# Patient Record
Sex: Male | Born: 1950 | Race: White | Hispanic: No | Smoking: Current some day smoker
Health system: Southern US, Community
[De-identification: ages and names within clinical notes are randomized; demographics above are authoritative.]

---

## 2014-09-03 ENCOUNTER — Emergency Department (HOSPITAL_COMMUNITY)
Admission: EM | Admit: 2014-09-03 | Discharge: 2014-09-04 | Disposition: A | Discharge status: Home / Self Care | Payer: PRIVATE HEALTH INSURANCE | Attending: Emergency Medicine | Admitting: Emergency Medicine

## 2014-09-03 ENCOUNTER — Emergency Department (HOSPITAL_COMMUNITY): Payer: PRIVATE HEALTH INSURANCE

## 2014-09-03 ENCOUNTER — Encounter (HOSPITAL_COMMUNITY): Payer: Self-pay | Admitting: Emergency Medicine

## 2014-09-03 DIAGNOSIS — R42 Dizziness and giddiness: Secondary | ICD-10-CM | POA: Insufficient documentation

## 2014-09-03 DIAGNOSIS — Z79899 Other long term (current) drug therapy: Secondary | ICD-10-CM | POA: Insufficient documentation

## 2014-09-03 DIAGNOSIS — H6123 Impacted cerumen, bilateral: Secondary | ICD-10-CM

## 2014-09-03 DIAGNOSIS — Z7951 Long term (current) use of inhaled steroids: Secondary | ICD-10-CM | POA: Diagnosis not present

## 2014-09-03 DIAGNOSIS — Z720 Tobacco use: Secondary | ICD-10-CM | POA: Diagnosis not present

## 2014-09-03 DIAGNOSIS — R200 Anesthesia of skin: Secondary | ICD-10-CM | POA: Insufficient documentation

## 2014-09-03 LAB — CBC WITH DIFFERENTIAL/PLATELET
BASOS PCT: 1 % (ref 0–1)
Basophils Absolute: 0.1 10*3/uL (ref 0.0–0.1)
EOS ABS: 0.1 10*3/uL (ref 0.0–0.7)
EOS PCT: 1 % (ref 0–5)
HCT: 41.9 % (ref 39.0–52.0)
HEMOGLOBIN: 14.3 g/dL (ref 13.0–17.0)
Lymphocytes Relative: 35 % (ref 12–46)
Lymphs Abs: 2.4 10*3/uL (ref 0.7–4.0)
MCH: 31.5 pg (ref 26.0–34.0)
MCHC: 34.1 g/dL (ref 30.0–36.0)
MCV: 92.3 fL (ref 78.0–100.0)
MONOS PCT: 9 % (ref 3–12)
Monocytes Absolute: 0.6 10*3/uL (ref 0.1–1.0)
Neutro Abs: 3.7 10*3/uL (ref 1.7–7.7)
Neutrophils Relative %: 54 % (ref 43–77)
Platelets: 229 10*3/uL (ref 150–400)
RBC: 4.54 MIL/uL (ref 4.22–5.81)
RDW: 13.5 % (ref 11.5–15.5)
WBC: 6.7 10*3/uL (ref 4.0–10.5)

## 2014-09-03 LAB — I-STAT CHEM 8, ED
BUN: 14 mg/dL (ref 6–23)
Calcium, Ion: 1.18 mmol/L (ref 1.13–1.30)
Chloride: 100 mmol/L (ref 96–112)
Creatinine, Ser: 1.1 mg/dL (ref 0.50–1.35)
Glucose, Bld: 91 mg/dL (ref 70–99)
HEMATOCRIT: 44 % (ref 39.0–52.0)
Hemoglobin: 15 g/dL (ref 13.0–17.0)
Potassium: 4 mmol/L (ref 3.5–5.1)
Sodium: 139 mmol/L (ref 135–145)
TCO2: 24 mmol/L (ref 0–100)

## 2014-09-03 NOTE — ED Notes (Addendum)
Pt st's he has been feeling dizzy since approx 8am today,  St's earlier today he had tingling sensation around his mouth but has subsided.  St's he has continued to feel dizzy.  Neuro exam in triage neg at this time

## 2014-09-04 MED ORDER — DOCUSATE SODIUM 50 MG/5ML PO LIQD
50.0000 mg | ORAL | Status: AC
  Administered 2014-09-04: 50 mg via OTIC
  Filled 2014-09-04: qty 10

## 2014-09-04 NOTE — Discharge Instructions (Signed)
Cerumen Impaction A cerumen impaction is when the wax in your ear forms a plug. This plug usually causes reduced hearing. Sometimes it also causes an earache or dizziness. Removing a cerumen impaction can be difficult and painful. The wax sticks to the ear canal. The canal is sensitive and bleeds easily. If you try to remove a heavy wax buildup with a cotton tipped swab, you may push it in further. Irrigation with water, suction, and small ear curettes may be used to clear out the wax. If the impaction is fixed to the skin in the ear canal, ear drops may be needed for a few days to loosen the wax. People who build up a lot of wax frequently can use ear wax removal products available in your local drugstore. SEEK MEDICAL CARE IF:  You develop an earache, increased hearing loss, or marked dizziness. Document Released: 07/29/2004 Document Revised: 09/13/2011 Document Reviewed: 09/18/2009 [facility] Patient Information 2015 [place], [place]. This information is not intended to replace advice given to you by your health care provider. Make sure you discuss any questions you have with your health care provider.  Dizziness  Dizziness means you feel unsteady or lightheaded. You might feel like you are going to pass out (faint). HOME CARE   Drink enough fluids to keep your pee (urine) clear or pale yellow.  Take your medicines exactly as told by your doctor. If you take blood pressure medicine, always stand up slowly from the lying or sitting position. Hold on to something to steady yourself.  If you need to stand in one place for a long time, move your legs often. Tighten and relax your leg muscles.  Have someone stay with you until you feel okay.  Do not drive or use heavy machinery if you feel dizzy.  Do not drink alcohol. GET HELP RIGHT AWAY IF:   You feel dizzy or lightheaded and it gets worse.  You feel sick to your stomach (nauseous), or you throw up (vomit).  You have trouble talking or  walking.  You feel weak or have trouble using your arms, hands, or legs.  You cannot think clearly or have trouble forming sentences.  You have chest pain, belly (abdominal) pain, sweating, or you are short of breath.  Your vision changes.  You are bleeding.  You have problems from your medicine that seem to be getting worse. MAKE SURE YOU:   Understand these instructions.  Will watch your condition.  Will get help right away if you are not doing well or get worse. Document Released: 06/10/2011 Document Revised: 09/13/2011 Document Reviewed: 06/10/2011 [facility] Patient Information 2015 [place], [place]. This information is not intended to replace advice given to you by your health care provider. Make sure you discuss any questions you have with your health care provider. Tonight you.  CT scan was normal.  If at anytime you develop numbness, tingling further dizziness, blurry vision.  Please go to the nearest hospital immediately.  Please follow-up with your primary care physician on your arrival.  Home

## 2014-09-04 NOTE — ED Notes (Signed)
Pt. Left with all belongings and refused wheelchair 

## 2014-09-04 NOTE — ED Provider Notes (Signed)
CSN: [id number]     Arrival date & time 09/03/14  1941 History   First MD Initiated Contact with Patient 09/03/14 2139     Chief Complaint  Patient presents with  . Dizziness     (Consider location/radiation/quality/duration/timing/severity/associated sxs/prior Treatment) HPI Comments: Patient reports that he's been dizzy but cannot quantify the dizziness.  He says the room is not spinning.  He does not feel like he needs to hold onto objects to walk or drive.  He denies any nausea.  He states about 6 PM he developed some numbness on his lower lip, worse on the left side than the right.  This has since resolved.  He and his daughter traveling for business.  [place].  He has a primary care physician at home only medication he takes is Nexium for GERD.  Has no history of TIAs, CVAs, hypertension, diabetes, head trauma. Denies any shortness of breath, chest pain, swelling in his legs, history of PE or DVT, no visual disturbances blurry vision, double vision halos  Patient is a 64 y.o. male presenting with dizziness. The history is provided by the patient.  Dizziness Quality:  Unable to specify Severity:  Mild Onset quality:  Gradual Duration:  14 hours Timing:  Constant Progression:  Unchanged Chronicity:  New Context: not when bending over, not with bowel movement, not with head movement, not with inactivity and not with physical activity   Relieved by:  None tried Worsened by:  Nothing Ineffective treatments:  None tried Associated symptoms: no headaches, no hearing loss, no nausea, no shortness of breath, no vision changes and no weakness   Risk factors: no multiple medications and no new medications     History reviewed. No pertinent past medical history. History reviewed. No pertinent past surgical history. No family history on file. History  Substance Use Topics  . Smoking status: Current Some Day Smoker    Types:  Cigars  . Smokeless tobacco: Not on file  . Alcohol Use: Yes     Comment: everyday    Review of Systems  Constitutional: Negative for fever.  HENT: Negative for congestion, ear discharge, ear pain, facial swelling and hearing loss.   Respiratory: Negative for shortness of breath.   Gastrointestinal: Negative for nausea.  Neurological: Positive for dizziness and numbness. Negative for weakness and headaches.  All other systems reviewed and are negative.     Allergies  Review of patient's allergies indicates no known allergies.  Home Medications   Prior to Admission medications   Medication Sig Start Date End Date Taking? Authorizing Provider  esomeprazole (NEXIUM) 40 MG capsule Take 40 mg by mouth daily at 12 noon.   Yes Historical Provider, MD  fluticasone (FLONASE) 50 MCG/ACT nasal spray Place 1 spray into both nostrils daily.   Yes Historical Provider, MD   BP 139/85 mmHg  Pulse 63  Temp(Src) 98.1 F (36.7 C) (Oral)  Resp 15  Ht 6' (1.829 m)  Wt 170 lb (77.111 kg)  BMI 23.05 kg/m2  SpO2 100% Physical Exam  Constitutional: He is oriented to person, place, and time. He appears well-developed and well-nourished. No distress.  HENT:  Head: Normocephalic and atraumatic.  I lateral cerumen impactions, left greater than right  Eyes: Pupils are equal, round, and reactive to light.  Neck: Normal range of motion.  Cardiovascular: Normal rate and regular rhythm.   Pulmonary/Chest: Effort normal and breath sounds normal.  Abdominal: Soft. Bowel sounds are  normal.  Musculoskeletal: Normal range of motion.  Lymphadenopathy:    He has no cervical adenopathy.  Neurological: He is alert and oriented to person, place, and time. He has normal strength. He displays normal reflexes. A cranial nerve deficit is present. No sensory deficit. He displays a negative Romberg sign.  Skin: Skin is warm. No rash noted. No pallor.  Nursing note and vitals reviewed.   ED Course  Procedures  (including critical care time) Labs Review Labs Reviewed  CBC WITH DIFFERENTIAL/PLATELET  I-STAT CHEM 8, ED    Imaging Review Ct Head Wo Contrast  09/04/2014   CLINICAL DATA:  Acute onset of dizziness. Tingling about the mouth. Initial encounter.  EXAM: CT HEAD WITHOUT CONTRAST  TECHNIQUE: Contiguous axial images were obtained from the base of the skull through the vertex without intravenous contrast.  COMPARISON:  None.  FINDINGS: There is no evidence of acute infarction, mass lesion, or intra- or extra-axial hemorrhage on CT.  Prominence of the ventricles and sulci reflects mild cortical volume loss. Mild cerebellar atrophy is noted.  The brainstem and fourth ventricle are within normal limits. The basal ganglia are unremarkable in appearance. The cerebral hemispheres demonstrate grossly normal gray-white differentiation. No mass effect or midline shift is seen.  There is no evidence of fracture; visualized osseous structures are unremarkable in appearance. The visualized portions of the orbits are within normal limits. The paranasal sinuses and mastoid air cells are well-aerated. Cerumen is noted at the external auditory canals bilaterally.  IMPRESSION: 1. No acute intracranial pathology seen on CT. 2. Mild cortical volume loss noted. 3. Cerumen noted at the external auditory canals bilaterally.   Electronically Signed   By: Roanna Raider M.D.   On: 09/04/2014 00:36     EKG Interpretation   Date/Time:  Tuesday September 03 2014 19:52:08 EST Ventricular Rate:  67 PR Interval:  188 QRS Duration: 88 QT Interval:  402 QTC Calculation: 424 R Axis:   61 Text Interpretation:  Normal sinus rhythm Normal ECG Confirmed by OTTER   MD, OLGA ([id number]) on 09/03/2014 11:50:06 PM     [place] CT scan results was discussed with the patient.  He was offered MRI after having his cerumen impactions removed.  He states he is back to his normal baseline.  He does have a primary care physician that he can follow at home.   He is been instructed to go to the closest hospital immediately if he develops symptoms similar to tonight's.   MDM   Final diagnoses:  Dizzy  Cerumen impaction, bilateral         Arman Filter, NP 09/04/14 [id number]  Olivia Mackie, MD 09/04/14 [phone]

## 2015-12-12 IMAGING — CT CT HEAD W/O CM
1 series · 15 of 30 positions shown, 19 images · non-contrast
Comparison: None.

CLINICAL DATA: Acute onset of dizziness. Tingling about the mouth.
Initial encounter.

EXAM:
CT HEAD WITHOUT CONTRAST
TECHNIQUE: Contiguous axial images were obtained from the base of the skull
through the vertex without intravenous contrast.

[Series 2: head 5.0 h30s · axial · 0.46mm/px · z∈[-147,-12]mm · 15 of 31 slices shown, 19 images]
[im 2/31  brain]
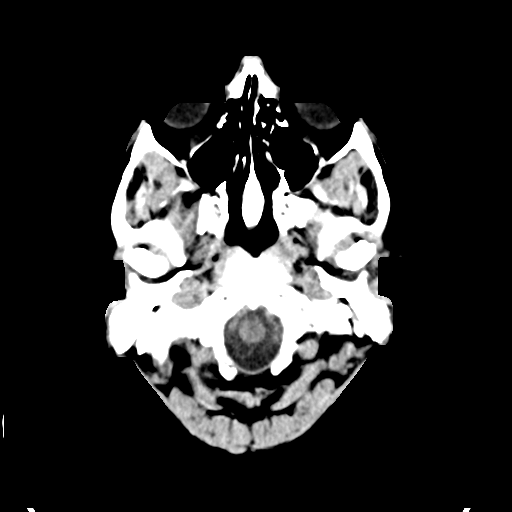
[im 2/31  bone]
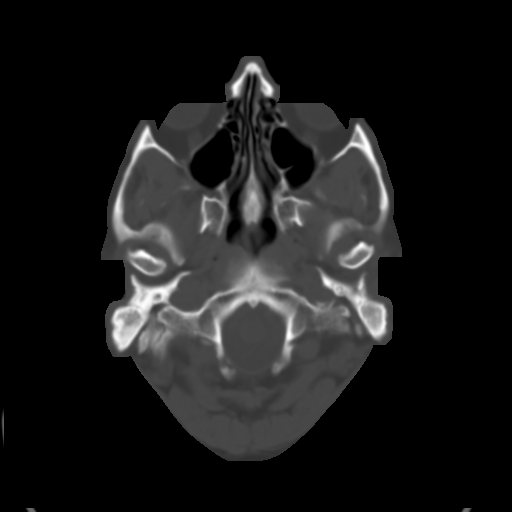
[im 4/31  brain]
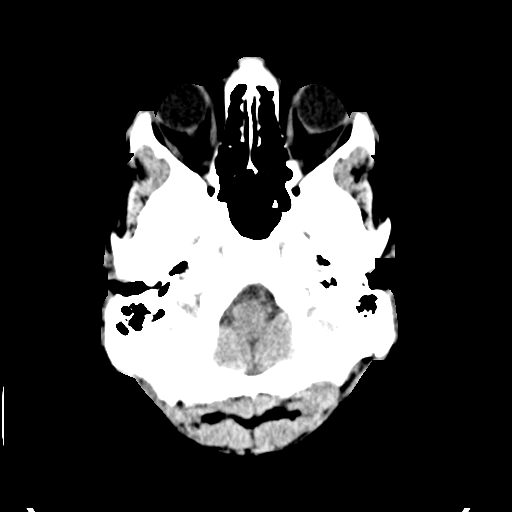
[im 6/31  brain]
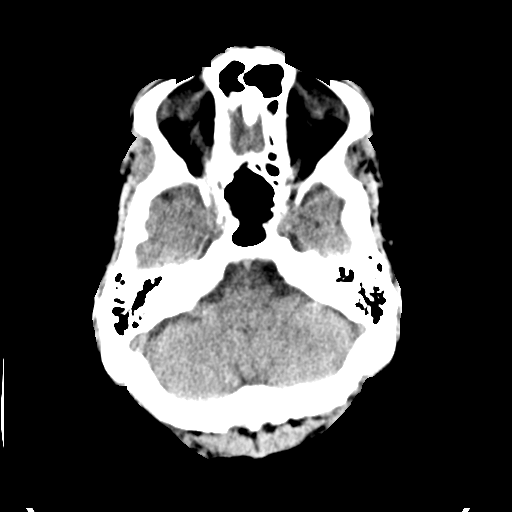
[im 8/31  brain]
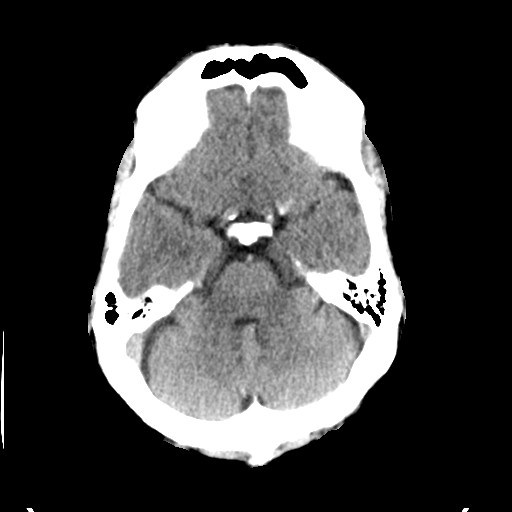
[im 10/31  brain]
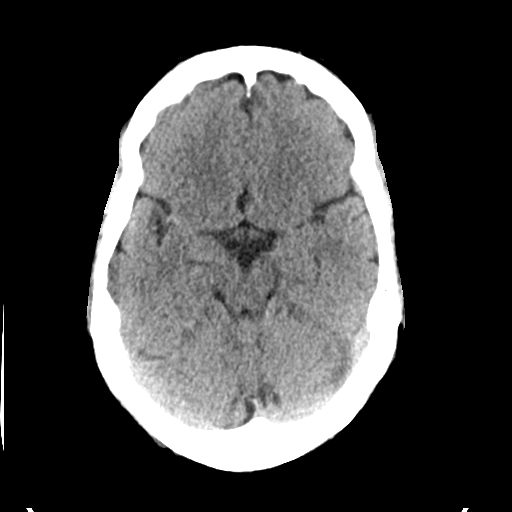
[im 10/31  bone]
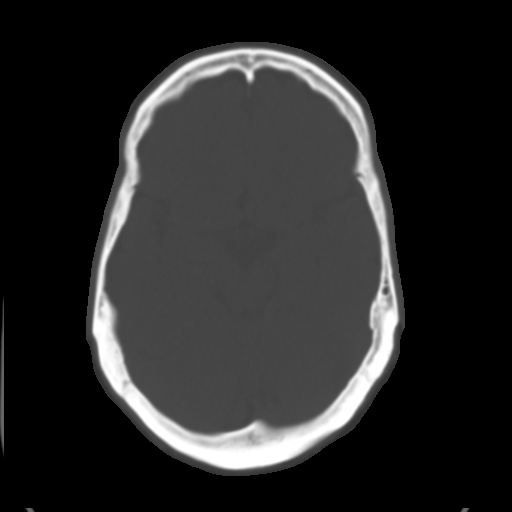
[im 12/31  brain]
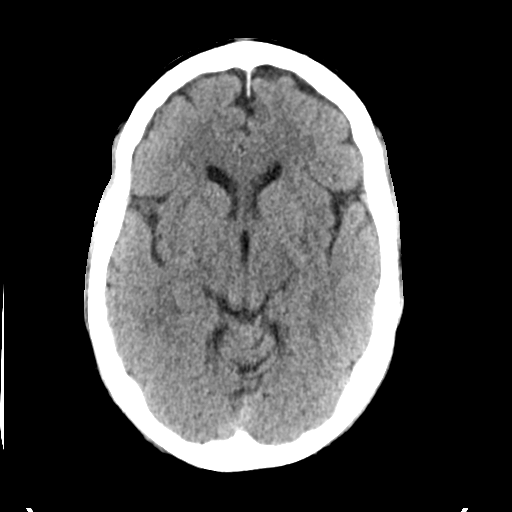
[im 14/31  brain]
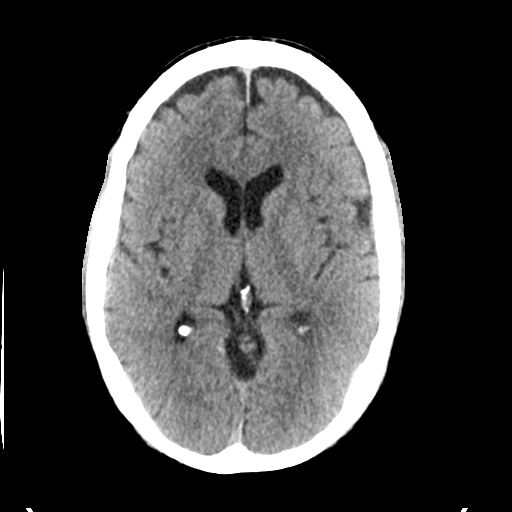
[im 16/31  brain]
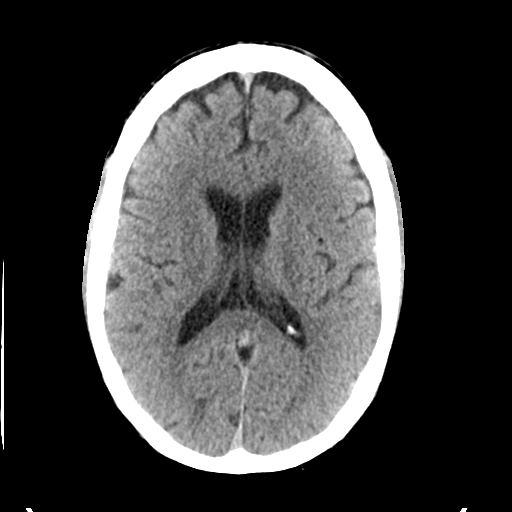
[im 17/31  brain]
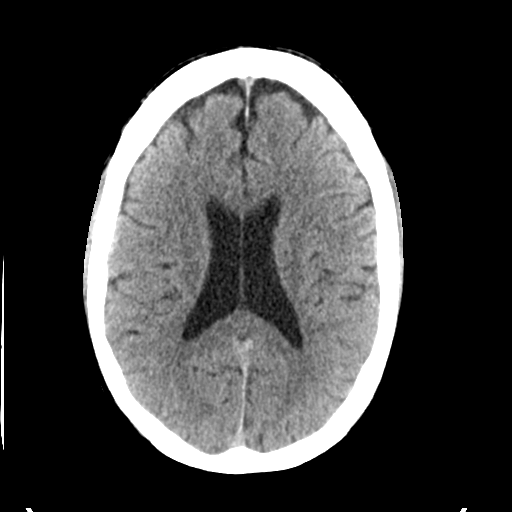
[im 17/31  bone]
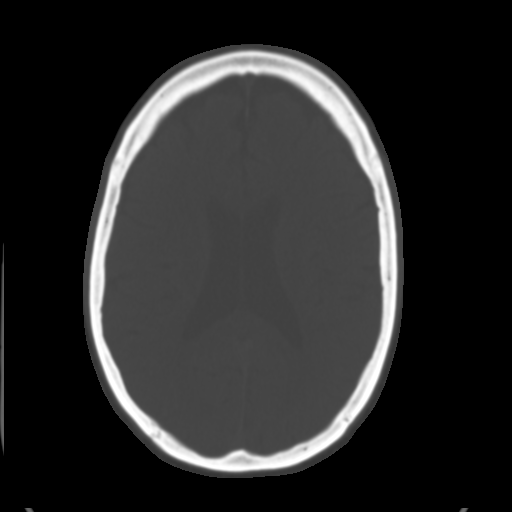
[im 19/31  brain]
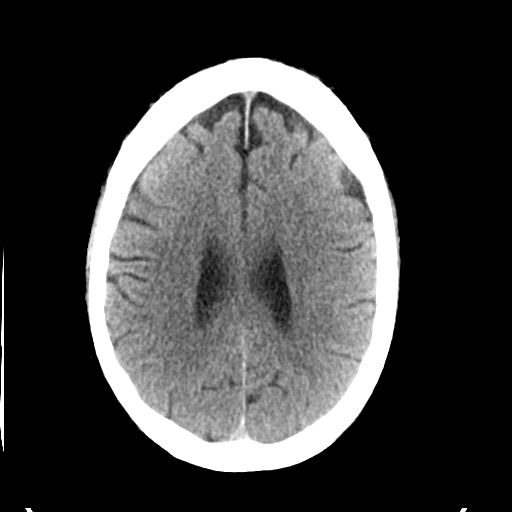
[im 21/31  brain]
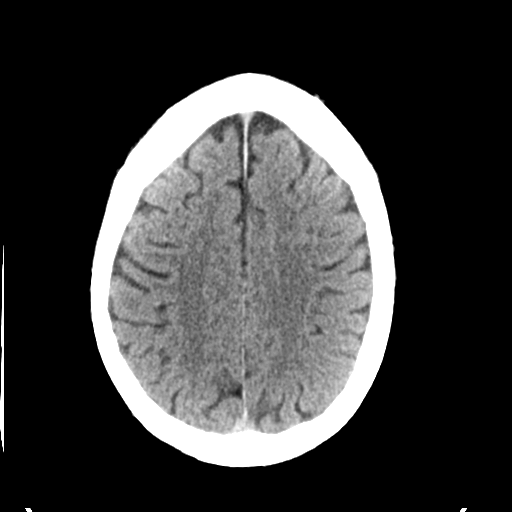
[im 23/31  brain]
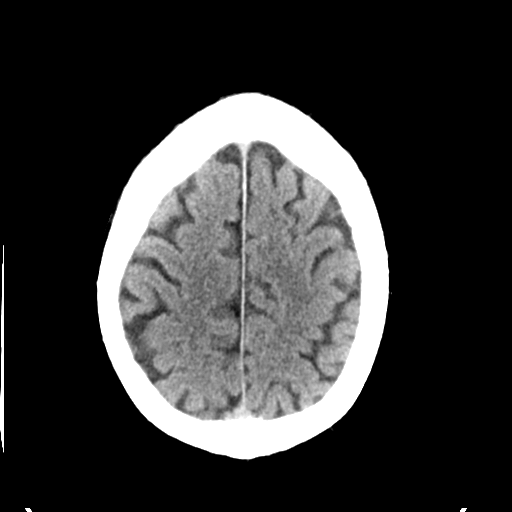
[im 25/31  brain]
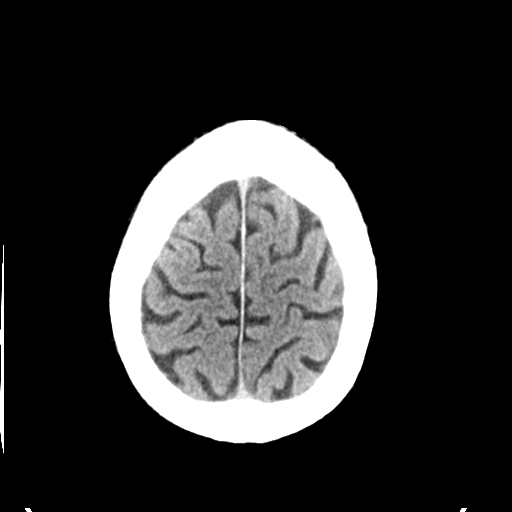
[im 25/31  bone]
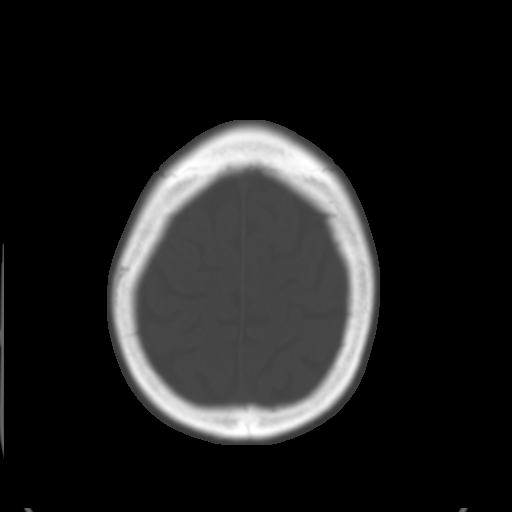
[im 27/31  brain]
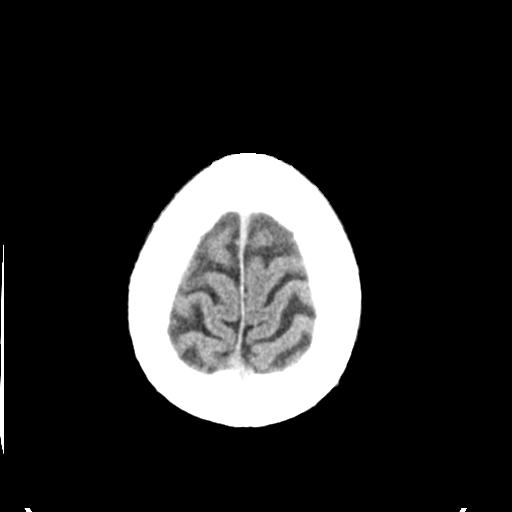
[im 29/31  brain]
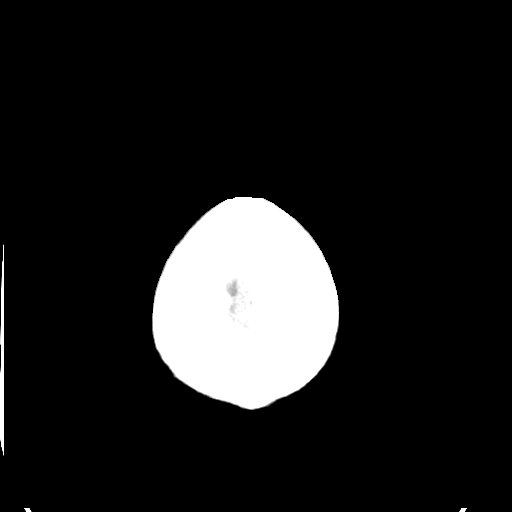

[15 of 30 positions shown; findings below may reference images not displayed]

FINDINGS: There is no evidence of acute infarction, mass lesion, or intra- or
extra-axial hemorrhage on CT.

Prominence of the ventricles and sulci reflects mild cortical volume
loss. Mild cerebellar atrophy is noted.

The brainstem and fourth ventricle are within normal limits. The
basal ganglia are unremarkable in appearance. The cerebral
hemispheres demonstrate grossly normal gray-white differentiation.
No mass effect or midline shift is seen.

There is no evidence of fracture; visualized osseous structures are
unremarkable in appearance. The visualized portions of the orbits
are within normal limits. The paranasal sinuses and mastoid air
cells are well-aerated. Cerumen is noted at the external auditory
canals bilaterally.
IMPRESSION: 1. No acute intracranial pathology seen on CT.
2. Mild cortical volume loss noted.
3. Cerumen noted at the external auditory canals bilaterally.
